# Patient Record
Sex: Male | Born: 1975
Health system: Southern US, Community
[De-identification: ages and names within clinical notes are randomized; demographics above are authoritative.]

## PROBLEM LIST (undated history)

## (undated) DIAGNOSIS — T7840XA Allergy, unspecified, initial encounter: Secondary | ICD-10-CM

## (undated) HISTORY — DX: Allergy, unspecified, initial encounter: T78.40XA

---

## 2001-10-31 ENCOUNTER — Encounter: Payer: Self-pay | Admitting: Emergency Medicine

## 2001-10-31 ENCOUNTER — Emergency Department (HOSPITAL_COMMUNITY): Admission: EM | Admit: 2001-10-31 | Discharge: 2001-10-31 | Payer: Self-pay | Admitting: Emergency Medicine

## 2003-03-20 ENCOUNTER — Emergency Department (HOSPITAL_COMMUNITY): Admission: EM | Admit: 2003-03-20 | Discharge: 2003-03-20 | Payer: Self-pay | Admitting: Emergency Medicine

## 2003-11-12 ENCOUNTER — Emergency Department (HOSPITAL_COMMUNITY): Admission: EM | Admit: 2003-11-12 | Discharge: 2003-11-12 | Payer: Self-pay
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
## 2009-07-20 ENCOUNTER — Ambulatory Visit: Payer: Self-pay | Admitting: Diagnostic Radiology

## 2009-07-20 ENCOUNTER — Emergency Department (HOSPITAL_BASED_OUTPATIENT_CLINIC_OR_DEPARTMENT_OTHER): Admission: EM | Admit: 2009-07-20 | Discharge: 2009-07-20 | Payer: Self-pay | Admitting: Emergency Medicine

## 2012-04-15 ENCOUNTER — Emergency Department (HOSPITAL_BASED_OUTPATIENT_CLINIC_OR_DEPARTMENT_OTHER): Payer: Self-pay

## 2012-04-15 ENCOUNTER — Emergency Department (HOSPITAL_BASED_OUTPATIENT_CLINIC_OR_DEPARTMENT_OTHER)
Admission: EM | Admit: 2012-04-15 | Discharge: 2012-04-15 | Disposition: A | Discharge status: Home / Self Care | Payer: Self-pay | Attending: Emergency Medicine | Admitting: Emergency Medicine

## 2012-04-15 ENCOUNTER — Encounter (HOSPITAL_BASED_OUTPATIENT_CLINIC_OR_DEPARTMENT_OTHER): Payer: Self-pay | Admitting: *Deleted

## 2012-04-15 DIAGNOSIS — S93409A Sprain of unspecified ligament of unspecified ankle, initial encounter: Secondary | ICD-10-CM | POA: Insufficient documentation

## 2012-04-15 DIAGNOSIS — Y929 Unspecified place or not applicable: Secondary | ICD-10-CM | POA: Insufficient documentation

## 2012-04-15 DIAGNOSIS — Y9389 Activity, other specified: Secondary | ICD-10-CM | POA: Insufficient documentation

## 2012-04-15 DIAGNOSIS — S93402A Sprain of unspecified ligament of left ankle, initial encounter: Secondary | ICD-10-CM

## 2012-04-15 DIAGNOSIS — F17200 Nicotine dependence, unspecified, uncomplicated: Secondary | ICD-10-CM | POA: Insufficient documentation

## 2012-04-15 DIAGNOSIS — W1789XA Other fall from one level to another, initial encounter: Secondary | ICD-10-CM | POA: Insufficient documentation
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
## 2012-04-16 NOTE — ED Provider Notes (Signed)
History     CSN: [id number]  Arrival date & time 04/15/12  1213   First MD Initiated Contact with Patient 04/15/12 1438      Chief Complaint  Patient presents with  . Ankle Injury    (Consider location/radiation/quality/duration/timing/severity/associated sxs/prior treatment) Patient is a 37 y.o. male presenting with lower extremity injury. The history is provided by the patient. No language interpreter was used.  Ankle Injury This is a new problem. The current episode started 2 days ago. The problem occurs constantly. The problem has not changed since onset.Associated symptoms comments: None . The symptoms are aggravated by standing and walking. Nothing relieves the symptoms. He has tried rest and a cold compress for the symptoms. The treatment provided mild relief.    History reviewed. No pertinent past medical history.  History reviewed. No pertinent past surgical history.  History reviewed. No pertinent family history.  History  Substance Use Topics  . Smoking status: Current Every Day Smoker    Types: Cigars  . Smokeless tobacco: Not on file  . Alcohol Use: No      Review of Systems  All other systems reviewed and are negative.    Allergies  Review of patient's allergies indicates no known allergies.  Home Medications  No current outpatient prescriptions on file.  BP 135/98  Pulse 88  Temp(Src) 97.8 F (36.6 C) (Oral)  Resp 15  Ht 5\' 8"  (1.727 m)  Wt 198 lb (89.812 kg)  BMI 30.11 kg/m2  SpO2 99%  Physical Exam  Constitutional: He is oriented to person, place, and time. He appears well-developed and well-nourished. No distress.  Musculoskeletal:  Mild swelling over the left lateral malleolus.  Skin intact.  Intact pulses, sensation and tendon function in the left foot.    Neurological: He is alert and oriented to person, place, and time.  No sensory or motor deficit.  Skin: Skin is warm and dry.  Psychiatric: He has a normal mood and affect. His  behavior is normal.    ED Course  Procedures (including critical care time)  Labs Reviewed - No data to display Dg Ankle Complete Left  04/15/2012  *RADIOLOGY REPORT*  Clinical Data: Left ankle pain after injury.  LEFT ANKLE COMPLETE - 3+ VIEW  Comparison: None.  Findings: No fracture or dislocation is noted.  Joint spaces are intact. No soft tissue abnormality is noted.  IMPRESSION: Normal left ankle.   Original Report Authenticated By: Lupita Raider.,  M.D.    Rx with ASO, ice elevation.  1. Sprain of left ankle        Carleene Cooper III, MD 04/16/12 1329

## 2013-03-26 ENCOUNTER — Emergency Department: Payer: Self-pay | Admitting: Emergency Medicine

## 2015-02-10 ENCOUNTER — Emergency Department (HOSPITAL_BASED_OUTPATIENT_CLINIC_OR_DEPARTMENT_OTHER): Payer: BLUE CROSS/BLUE SHIELD

## 2015-02-10 ENCOUNTER — Emergency Department (HOSPITAL_BASED_OUTPATIENT_CLINIC_OR_DEPARTMENT_OTHER)
Admission: EM | Admit: 2015-02-10 | Discharge: 2015-02-11 | Disposition: A | Discharge status: Home / Self Care | Payer: BLUE CROSS/BLUE SHIELD | Attending: Emergency Medicine | Admitting: Emergency Medicine

## 2015-02-10 ENCOUNTER — Encounter (HOSPITAL_BASED_OUTPATIENT_CLINIC_OR_DEPARTMENT_OTHER): Payer: Self-pay | Admitting: *Deleted

## 2015-02-10 DIAGNOSIS — S29001A Unspecified injury of muscle and tendon of front wall of thorax, initial encounter: Secondary | ICD-10-CM | POA: Insufficient documentation

## 2015-02-10 DIAGNOSIS — Y9389 Activity, other specified: Secondary | ICD-10-CM | POA: Insufficient documentation

## 2015-02-10 DIAGNOSIS — Y92410 Unspecified street and highway as the place of occurrence of the external cause: Secondary | ICD-10-CM | POA: Insufficient documentation

## 2015-02-10 DIAGNOSIS — S298XXA Other specified injuries of thorax, initial encounter: Secondary | ICD-10-CM

## 2015-02-10 DIAGNOSIS — S3991XA Unspecified injury of abdomen, initial encounter: Secondary | ICD-10-CM | POA: Insufficient documentation

## 2015-02-10 DIAGNOSIS — Y998 Other external cause status: Secondary | ICD-10-CM | POA: Insufficient documentation

## 2015-02-10 DIAGNOSIS — F17210 Nicotine dependence, cigarettes, uncomplicated: Secondary | ICD-10-CM | POA: Insufficient documentation

## 2015-02-10 NOTE — ED Notes (Signed)
MD at bedside. 
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
## 2015-02-11 MED ORDER — FENTANYL CITRATE (PF) 100 MCG/2ML IJ SOLN
100.0000 ug | Freq: Once | INTRAMUSCULAR | Status: AC
  Administered 2015-02-11: 100 ug via INTRAVENOUS
  Filled 2015-02-11: qty 2

## 2015-02-11 MED ORDER — IOHEXOL 300 MG/ML  SOLN
25.0000 mL | Freq: Once | INTRAMUSCULAR | Status: AC | PRN
  Administered 2015-02-11: 25 mL via ORAL

## 2015-02-11 MED ORDER — HYDROCODONE-ACETAMINOPHEN 5-325 MG PO TABS: 1.0000 | ORAL_TABLET | Freq: Four times a day (QID) | ORAL | Status: DC | PRN

## 2015-02-11 MED ORDER — IOHEXOL 300 MG/ML  SOLN
100.0000 mL | Freq: Once | INTRAMUSCULAR | Status: AC | PRN
  Administered 2015-02-11: 100 mL via INTRAVENOUS

## 2015-02-11 NOTE — ED Notes (Signed)
Patient transported to CT 

## 2015-02-11 NOTE — ED Notes (Signed)
MD at bedside discussing test results and dispo plan of care. 

## 2015-02-11 NOTE — Discharge Instructions (Signed)

## 2016-10-25 DIAGNOSIS — J302 Other seasonal allergic rhinitis: Secondary | ICD-10-CM | POA: Insufficient documentation

## 2016-10-25 DIAGNOSIS — H1045 Other chronic allergic conjunctivitis: Secondary | ICD-10-CM | POA: Insufficient documentation

## 2016-10-25 DIAGNOSIS — E782 Mixed hyperlipidemia: Secondary | ICD-10-CM | POA: Insufficient documentation

## 2016-10-25 HISTORY — DX: Other chronic allergic conjunctivitis: H10.45

## 2018-12-31 ENCOUNTER — Other Ambulatory Visit: Payer: Self-pay

## 2019-01-01 ENCOUNTER — Encounter: Payer: Self-pay | Admitting: Nurse Practitioner

## 2019-01-01 ENCOUNTER — Ambulatory Visit (INDEPENDENT_AMBULATORY_CARE_PROVIDER_SITE_OTHER): Payer: BC Managed Care – PPO | Admitting: Nurse Practitioner

## 2019-01-01 VITALS — BP 118/76 | HR 85 | Temp 98.2°F | Ht 68.0 in | Wt 198.8 lb

## 2019-01-01 DIAGNOSIS — Z136 Encounter for screening for cardiovascular disorders: Secondary | ICD-10-CM

## 2019-01-01 DIAGNOSIS — Z13220 Encounter for screening for lipoid disorders: Secondary | ICD-10-CM | POA: Diagnosis not present

## 2019-01-01 DIAGNOSIS — Z0001 Encounter for general adult medical examination with abnormal findings: Secondary | ICD-10-CM

## 2019-01-01 DIAGNOSIS — Z23 Encounter for immunization: Secondary | ICD-10-CM | POA: Diagnosis not present

## 2019-01-01 DIAGNOSIS — K644 Residual hemorrhoidal skin tags: Secondary | ICD-10-CM

## 2019-01-01 DIAGNOSIS — Z114 Encounter for screening for human immunodeficiency virus [HIV]: Secondary | ICD-10-CM | POA: Diagnosis not present

## 2019-01-01 MED ORDER — NITROGLYCERIN 2 % TD OINT
TOPICAL_OINTMENT | TRANSDERMAL | 0 refills | Status: DC
Start: 2019-01-01 — End: 2019-01-07

## 2019-01-01 NOTE — Patient Instructions (Signed)
Schedule dental exam every 50months and eye exam every 1-10yrs.  Go to lab for blood draw.  If no improvement with hemorrhoid, contact office.  Nitroglycerin rectal ointment What is this medicine? NITROGLYCERIN (nye troe GLI ser in) is a type of vasodilator that can decrease pressure within the anus. It is used to treat moderate to severe pain caused by a tear in the skin lining the anal canal. This medicine may be used for other purposes; ask your health care provider or pharmacist if you have questions. COMMON BRAND NAME(S): RECTIV What should I tell my health care provider before I take this medicine? They need to know if you have any of these conditions:  head injury, recent stroke, or bleeding in the brain  heart disease  low blood counts, like red cell counts  low blood pressure  migraines or recurrent headaches  an unusual or allergic reaction to nitroglycerin, other medicines, foods, dyes, or preservatives  pregnant or trying to get pregnant  breast-feeding How should I use this medicine? This medicine is for rectal use only. Do not take by mouth. Follow the directions on the prescription label. Use exactly as directed. Cover a finger with plastic-wrap or surgical glove. Use the dosing guide on the medicine box to measure the dose. With the ointment on the covered finger, insert into anal canal up to the first finger joint. After application, throw away the finger covering. Wash hands after applying ointment. Talk to your pediatrician regarding the use of this medicine in children. Special care may be needed. Overdosage: If you think you have taken too much of this medicine contact a poison control center or emergency room at once. NOTE: This medicine is only for you. Do not share this medicine with others. What if I miss a dose? If you miss a dose, use it as soon as you can. If it is almost time for your next dose, use only that dose. Do not use double or extra doses. What  may interact with this medicine? Do not take this medicine with any of the following medications:  medicines used to treat erectile dysfunction like sildenafil, tadalafil, and vardenafil  riociguat This medicine may also interact with the following medications:  alteplase  aspirin  certain migraine medicines like ergotamine and dihydroergotamine (DHE)  heparin  medicines for high blood pressure  other medicines used to treat angina This list may not describe all possible interactions. Give your health care provider a list of all the medicines, herbs, non-prescription drugs, or dietary supplements you use. Also tell them if you smoke, drink alcohol, or use illegal drugs. Some items may interact with your medicine. What should I watch for while using this medicine? Tell your doctor or health care professional if you feel your medicine is no longer working. You may get drowsy or dizzy. Do not drive, use machinery, or do anything that needs mental alertness until you know how this drug affects you. Do not stand or sit up quickly, especially if you are an older patient. This reduces the risk of dizzy or fainting spells. Alcohol can make you more drowsy and dizzy. Avoid alcoholic drinks. Do not treat yourself for coughs, colds, or pain while you are taking this medicine without asking your doctor or health care professional for advice. Some ingredients may increase your blood pressure. What side effects may I notice from receiving this medicine? Side effects that you should report to your doctor or health care professional as soon as possible:  allergic reactions like skin rash, itching or hives, swelling of the face, lips, or tongue Side effects that usually do not require medical attention (report to your doctor or health care professional if they continue or are bothersome):  dizziness  feeling faint or lightheaded, falls  headache This list may not describe all possible side effects.  Call your doctor for medical advice about side effects. You may report side effects to FDA at 1-800-FDA-1088. Where should I keep my medicine? Keep out of the reach of children. Store at room temperature between 20 and 25 degrees C (68 and 77 degrees F). Close tightly after each use. Throw away any unused medicine after the expiration date. NOTE: This sheet is a summary. It may not cover all possible information. If you have questions about this medicine, talk to your doctor, pharmacist, or health care provider.  2020 Elsevier/Gold Standard (2015-03-26 09:01:08)  Health Maintenance, Male Adopting a healthy lifestyle and getting preventive care are important in promoting health and wellness. Ask your health care provider about:  The right schedule for you to have regular tests and exams.  Things you can do on your own to prevent diseases and keep yourself healthy. What should I know about diet, weight, and exercise? Eat a healthy diet   Eat a diet that includes plenty of vegetables, fruits, low-fat dairy products, and lean protein.  Do not eat a lot of foods that are high in solid fats, added sugars, or sodium. Maintain a healthy weight Body mass index (BMI) is a measurement that can be used to identify possible weight problems. It estimates body fat based on height and weight. Your health care provider can help determine your BMI and help you achieve or maintain a healthy weight. Get regular exercise Get regular exercise. This is one of the most important things you can do for your health. Most adults should:  Exercise for at least 150 minutes each week. The exercise should increase your heart rate and make you sweat (moderate-intensity exercise).  Do strengthening exercises at least twice a week. This is in addition to the moderate-intensity exercise.  Spend less time sitting. Even light physical activity can be beneficial. Watch cholesterol and blood lipids Have your blood tested  for lipids and cholesterol at 44 years of age, then have this test every 5 years. You may need to have your cholesterol levels checked more often if:  Your lipid or cholesterol levels are high.  You are older than 44 years of age.  You are at high risk for heart disease. What should I know about cancer screening? Many types of cancers can be detected early and may often be prevented. Depending on your health history and family history, you may need to have cancer screening at various ages. This may include screening for:  Colorectal cancer.  Prostate cancer.  Skin cancer.  Lung cancer. What should I know about heart disease, diabetes, and high blood pressure? Blood pressure and heart disease  High blood pressure causes heart disease and increases the risk of stroke. This is more likely to develop in people who have high blood pressure readings, are of African descent, or are overweight.  Talk with your health care provider about your target blood pressure readings.  Have your blood pressure checked: ? Every 3-5 years if you are 77-45 years of age. ? Every year if you are 22 years old or older.  If you are between the ages of 38 and 20 and are a current  or former smoker, ask your health care provider if you should have a one-time screening for abdominal aortic aneurysm (AAA). Diabetes Have regular diabetes screenings. This checks your fasting blood sugar level. Have the screening done:  Once every three years after age 44 if you are at a normal weight and have a low risk for diabetes.  More often and at a younger age if you are overweight or have a high risk for diabetes. What should I know about preventing infection? Hepatitis B If you have a higher risk for hepatitis B, you should be screened for this virus. Talk with your health care provider to find out if you are at risk for hepatitis B infection. Hepatitis C Blood testing is recommended for:  Everyone born from [id number]  through 1966.  Anyone with known risk factors for hepatitis C. Sexually transmitted infections (STIs)  You should be screened each year for STIs, including gonorrhea and chlamydia, if: ? You are sexually active and are younger than 44 years of age. ? You are older than 44 years of age and your health care provider tells you that you are at risk for this type of infection. ? Your sexual activity has changed since you were last screened, and you are at increased risk for chlamydia or gonorrhea. Ask your health care provider if you are at risk.  Ask your health care provider about whether you are at high risk for HIV. Your health care provider may recommend a prescription medicine to help prevent HIV infection. If you choose to take medicine to prevent HIV, you should first get tested for HIV. You should then be tested every 3 months for as long as you are taking the medicine. Follow these instructions at home: Lifestyle  Do not use any products that contain nicotine or tobacco, such as cigarettes, e-cigarettes, and chewing tobacco. If you need help quitting, ask your health care provider.  Do not use street drugs.  Do not share needles.  Ask your health care provider for help if you need support or information about quitting drugs. Alcohol use  Do not drink alcohol if your health care provider tells you not to drink.  If you drink alcohol: ? Limit how much you have to 0-2 drinks a day. ? Be aware of how much alcohol is in your drink. In the [place] one drink equals one 12 oz bottle of beer (355 mL), one 5 oz glass of wine (148 mL), or one 1 oz glass of hard liquor (44 mL). General instructions  Schedule regular health, dental, and eye exams.  Stay current with your vaccines.  Tell your health care provider if: ? You often feel depressed. ? You have ever been abused or do not feel safe at home. Summary  Adopting a healthy lifestyle and getting preventive care are important in  promoting health and wellness.  Follow your health care provider's instructions about healthy diet, exercising, and getting tested or screened for diseases.  Follow your health care provider's instructions on monitoring your cholesterol and blood pressure. This information is not intended to replace advice given to you by your health care provider. Make sure you discuss any questions you have with your health care provider. Document Released: 08/20/2007 Document Revised: 02/14/2018 Document Reviewed: 02/14/2018 Elsevier Patient Education  2020 [employer]

## 2019-01-01 NOTE — Progress Notes (Signed)
Subjective:    Patient ID: Thomas Suarez, male    DOB: [birth year], 43 y.o.   MRN: [id number]  Patient presents today for complete physical and eval of hemorrhoid  HPI Hemorrhoid: Chronic, ongoing for years, waxing and weaning, no rectal bleeding, no rectal pain, no ABD pain, no constipation of diarrhea. Minimal improvement with preparation H, lidocaine, hydrocortisone and epsom salt sitz bath.  Sexual History (orientation,birth control, marital status, STD):single, sexually active, male partner.  Depression/Suicide: Depression screen [facility] 2/9 01/01/2019  Decreased Interest 0  Down, Depressed, Hopeless 0  PHQ - 2 Score 0   Vision:will schedule, use of corrective lens  Dental:will schedule  Immunizations: (TDAP, Hep C screen, Pneumovax, Influenza, zoster)  Health Maintenance  Topic Date Due  . HIV Screening  07/16/1989  . Flu Shot  06/05/2019*  . Tetanus Vaccine  12/31/2028  *Topic was postponed. The date shown is not the original due date.   Diet:heart healthy  Weight:  Wt Readings from Last 3 Encounters:  01/01/19 198 lb 12.8 oz (90.2 kg)  02/10/15 190 lb (86.2 kg)  04/15/12 198 lb (89.8 kg)    Exercise:5x/week, cardio and weight training.  Fall Risk: Fall Risk  01/01/2019  Falls in the past year? 0   Advanced Directive: Advanced Directives 02/10/2015  Does Patient Have a Medical Advance Directive? No    Medications and allergies reviewed with patient and updated if appropriate.  There are no active problems to display for this patient.   Current Outpatient Medications on File Prior to Visit  Medication Sig Dispense Refill  . fluticasone (FLONASE) 50 MCG/ACT nasal spray Place into the nose.    . montelukast (SINGULAIR) 10 MG tablet Take 10 mg by mouth daily.    Marland Kitchen HYDROcodone-acetaminophen (NORCO/VICODIN) 5-325 MG tablet Take 1-2 tablets by mouth every 6 (six) hours as needed (for pain). (Patient not taking: Reported on 01/01/2019) 20 tablet 0   No  current facility-administered medications on file prior to visit.     Past Medical History:  Diagnosis Date  . Allergy     No past surgical history on file.  Social History   Socioeconomic History  . Marital status: Single    Spouse name: Not on file  . Number of children: Not on file  . Years of education: Not on file  . Highest education level: Not on file  Occupational History  . Not on file  Social Needs  . Financial resource strain: Not on file  . Food insecurity    Worry: Not on file    Inability: Not on file  . Transportation needs    Medical: Not on file    Non-medical: Not on file  Tobacco Use  . Smoking status: Former Smoker    Types: Cigars    Quit date: 03/07/2008    Years since quitting: 10.8  . Smokeless tobacco: Never Used  Substance and Sexual Activity  . Alcohol use: No  . Drug use: No  . Sexual activity: Not on file  Lifestyle  . Physical activity    Days per week: Not on file    Minutes per session: Not on file  . Stress: Not on file  Relationships  . Social Herbalist on phone: Not on file    Gets together: Not on file    Attends religious service: Not on file    Active member of club or organization: Not on file    Attends meetings of clubs or organizations: Not  on file    Relationship status: Not on file  Other Topics Concern  . Not on file  Social History Narrative  . Not on file    Family History  Problem Relation Age of Onset  . Hypertension Maternal Grandmother         Review of Systems  Constitutional: Negative for fever, malaise/fatigue and weight loss.  HENT: Negative for congestion and sore throat.   Eyes:       Negative for visual changes  Respiratory: Negative for cough and shortness of breath.   Cardiovascular: Negative for chest pain, palpitations and leg swelling.  Gastrointestinal: Negative for abdominal pain, blood in stool, constipation, diarrhea, heartburn and nausea.  Genitourinary: Negative for  dysuria, frequency and urgency.  Musculoskeletal: Negative for falls, joint pain and myalgias.  Skin: Negative for rash.  Neurological: Negative for dizziness, sensory change and headaches.  Endo/Heme/Allergies: Does not bruise/bleed easily.  Psychiatric/Behavioral: Negative for depression, substance abuse and suicidal ideas. The patient is not nervous/anxious.     Objective:   Vitals:   01/01/19 1441  BP: 118/76  Pulse: 85  Temp: 98.2 F (36.8 C)  SpO2: 97%    Body mass index is 30.23 kg/m.   Physical Examination:  Physical Exam Exam conducted with a chaperone present.  Constitutional:      General: He is not in acute distress.    Appearance: He is obese.  HENT:     Head: Normocephalic.     Right Ear: Tympanic membrane, ear canal and external ear normal.     Left Ear: Tympanic membrane, ear canal and external ear normal.  Eyes:     Extraocular Movements: Extraocular movements intact.     Conjunctiva/sclera: Conjunctivae normal.  Neck:     Musculoskeletal: Normal range of motion and neck supple.     Thyroid: No thyromegaly.  Cardiovascular:     Rate and Rhythm: Normal rate.     Heart sounds: Normal heart sounds.  Pulmonary:     Effort: Pulmonary effort is normal.     Breath sounds: Normal breath sounds.  Chest:     Chest wall: No tenderness.  Abdominal:     General: Bowel sounds are normal. There is no distension.     Palpations: Abdomen is soft.     Tenderness: There is no abdominal tenderness.  Genitourinary:    Rectum: Tenderness, anal fissure and external hemorrhoid present.  Musculoskeletal: Normal range of motion.        General: No tenderness.  Lymphadenopathy:     Cervical: No cervical adenopathy.  Skin:    General: Skin is warm and dry.  Neurological:     Mental Status: He is alert and oriented to person, place, and time.  Psychiatric:        Mood and Affect: Mood normal.        Behavior: Behavior normal.        Thought Content: Thought  content normal.    ASSESSMENT and PLAN:  Artis FlockJamaal was seen today for establish care.  Diagnoses and all orders for this visit:  Encounter for preventative adult health care exam with abnormal findings -     CBC -     Comprehensive metabolic panel -     Lipid panel -     TSH  Inflamed external hemorrhoid -     nitroGLYCERIN (NITROGLYN) 2 % ointment; Apply pea size to external hemorrhoid twice a day x5days  Encounter for lipid screening for cardiovascular disease -  Lipid panel  Need for diphtheria-tetanus-pertussis (Tdap) vaccine -     Tdap vaccine greater than or equal to 7yo IM  Encounter for screening for HIV -     HIV antibody (with reflex)    No problem-specific Assessment & Plan notes found for this encounter.      Problem List Items Addressed This Visit    None    Visit Diagnoses    Encounter for preventative adult health care exam with abnormal findings    -  Primary   Relevant Orders   CBC   Comprehensive metabolic panel   Lipid panel   TSH   Inflamed external hemorrhoid       Relevant Medications   nitroGLYCERIN (NITROGLYN) 2 % ointment   Encounter for lipid screening for cardiovascular disease       Relevant Orders   Lipid panel   Need for diphtheria-tetanus-pertussis (Tdap) vaccine       Relevant Orders   Tdap vaccine greater than or equal to 7yo IM (Completed)   Encounter for screening for HIV       Relevant Orders   HIV antibody (with reflex)       Follow up: Return if symptoms worsen or fail to improve.  Alysia Penna, NP

## 2019-01-02 LAB — CBC
HCT: 45.5 % (ref 39.0–52.0)
Hemoglobin: 15.5 g/dL (ref 13.0–17.0)
MCHC: 34.1 g/dL (ref 30.0–36.0)
MCV: 96.4 fl (ref 78.0–100.0)
Platelets: 244 10*3/uL (ref 150.0–400.0)
RBC: 4.72 Mil/uL (ref 4.22–5.81)
RDW: 13 % (ref 11.5–15.5)
WBC: 4.2 10*3/uL (ref 4.0–10.5)

## 2019-01-03 ENCOUNTER — Other Ambulatory Visit: Payer: Self-pay | Admitting: Nurse Practitioner

## 2019-01-03 DIAGNOSIS — Z114 Encounter for screening for human immunodeficiency virus [HIV]: Secondary | ICD-10-CM

## 2019-01-03 LAB — LIPID PANEL
Cholesterol: 196 mg/dL (ref <200)
HDL: 41 mg/dL (ref >=40)
LDL Cholesterol (Calc): 135 mg/dL (calc) — ABNORMAL HIGH
Non-HDL Cholesterol (Calc): 155 mg/dL (calc) — ABNORMAL HIGH (ref <130)
Total CHOL/HDL Ratio: 4.8 (calc) (ref <5.0)
Triglycerides: 97 mg/dL (ref <150)

## 2019-01-03 LAB — COMPREHENSIVE METABOLIC PANEL
AG Ratio: 1.8 (calc) (ref 1.0–2.5)
ALT: 17 U/L (ref 9–46)
AST: 15 U/L (ref 10–40)
Albumin: 4.9 g/dL (ref 3.6–5.1)
Alkaline phosphatase (APISO): 63 U/L (ref 36–130)
BUN: 15 mg/dL (ref 7–25)
CO2: 24 mmol/L (ref 20–32)
Calcium: 10.1 mg/dL (ref 8.6–10.3)
Chloride: 103 mmol/L (ref 98–110)
Creat: 1.07 mg/dL (ref 0.60–1.35)
Globulin: 2.7 g/dL (calc) (ref 1.9–3.7)
Glucose, Bld: 87 mg/dL (ref 65–99)
Potassium: 4.3 mmol/L (ref 3.5–5.3)
Sodium: 138 mmol/L (ref 135–146)
Total Bilirubin: 0.5 mg/dL (ref 0.2–1.2)
Total Protein: 7.6 g/dL (ref 6.1–8.1)

## 2019-01-03 LAB — TSH: TSH: 0.45 mIU/L (ref 0.40–4.50)

## 2019-01-03 LAB — HIV ANTIBODY (ROUTINE TESTING W REFLEX)

## 2019-01-07 ENCOUNTER — Telehealth: Payer: Self-pay | Admitting: Nurse Practitioner

## 2019-01-07 DIAGNOSIS — K644 Residual hemorrhoidal skin tags: Secondary | ICD-10-CM

## 2019-01-07 NOTE — Telephone Encounter (Signed)
Patient states he was advised to d/c nitroGLYCERIN (NITROGLYN) 2 % ointment and to inform PCP if symptoms did not improve. Patient experiencing head aches long lasting and patient would like a follow up call from nurse. Patient states PCP recommend placing a referral if symptoms did not improve, please advise

## 2019-01-07 NOTE — Telephone Encounter (Signed)
Pt stated he finished the nitroglyn Saturday but he still experiencing pain and swelling. Pt also mention that right after he takes this med he notice a bad headache--not sure if this is a side effect.   Charlotte please advise.

## 2019-01-07 NOTE — Telephone Encounter (Signed)
Unfortunately headache is one of the side effects. At this point I this he will benefit from an evaluation by GI.

## 2019-01-08 NOTE — Telephone Encounter (Signed)
Pt is aware. He is unable to take the appt offer on 11/9 and 11/10--has to work.

## 2019-01-15 ENCOUNTER — Other Ambulatory Visit: Payer: Self-pay | Admitting: Nurse Practitioner

## 2019-01-15 ENCOUNTER — Encounter: Payer: Self-pay | Admitting: Nurse Practitioner

## 2019-01-15 MED ORDER — MONTELUKAST SODIUM 10 MG PO TABS: 10.0000 mg | ORAL_TABLET | Freq: Every day | ORAL | 3 refills | Status: DC

## 2019-01-15 NOTE — Telephone Encounter (Signed)
Please advise, we haven't send in this rx before.

## 2019-01-15 NOTE — Telephone Encounter (Signed)
Requested medication (s) are due for refill today: yes  Requested medication (s) are on the active medication list:yes  Last refill: 12/2018  Future visit scheduled: no  Notes to clinic: [place] filled by historical provider   Patient requesting a 90 day supply montelukast (SINGULAIR) 10 MG tablet , informed patient please allow 48 to 72 hour turn around time. Patient states he's completely out and would like request expedited   Requested Prescriptions  Pending Prescriptions Disp Refills   montelukast (SINGULAIR) 10 MG tablet       Sig: Take 1 tablet (10 mg total) by mouth daily.     Pulmonology:  Leukotriene Inhibitors Passed - 01/15/2019 12:12 PM      Passed - Valid encounter within last 12 months    Recent Outpatient Visits          2 weeks ago Encounter for preventative adult health care exam with abnormal findings   [facility] [address] Charlene Brooke, NP

## 2019-01-15 NOTE — Telephone Encounter (Signed)
Patient requesting a 90 day supply montelukast (SINGULAIR) 10 MG tablet , informed patient please allow 48 to 72 hour turn around time. Patient states he's completely out and would like request expedited   [place], [facility] [place]

## 2019-01-16 ENCOUNTER — Telehealth: Payer: Self-pay | Admitting: Nurse Practitioner

## 2019-01-16 DIAGNOSIS — K644 Residual hemorrhoidal skin tags: Secondary | ICD-10-CM

## 2019-01-16 NOTE — Telephone Encounter (Signed)
Thomas Suarez please advise   Copied from [place] [id number]. Topic: Referral - Status >> Jan 16, 2019  1:36 PM Scherrie Gerlach wrote: Reason for CRM: pt states [facility] advised pt they will not remove the hemorrhoid, only give him medication. Pt states he wants it removed.  Pt wants a referral to have it gone, removed.  Pt does not want to go there if they are not going to take off, and they told him they were not. Pt states this is an urgent and he is not better.  Pt is going to cancel GI appt.

## 2019-01-17 ENCOUNTER — Ambulatory Visit: Payer: BC Managed Care – PPO | Admitting: Physician Assistant

## 2019-01-17 NOTE — Telephone Encounter (Signed)
Pt is aware, referral entered.  

## 2019-01-17 NOTE — Telephone Encounter (Signed)
I spoke with the pt and he said he wants it remove, pt does want just medication to treat pain.   I spoke with the [facility] department, they stated that only treatment they are doing over there for external hemorrhoid is to give medication for pain. They work more with internal hemorrhoid. If the pt needs to get external hemorrhoid remove---referral need to go to surgeon.   Please advise.

## 2019-01-17 NOTE — Telephone Encounter (Signed)
I say the same thing I said before. A referral to GI or surgeon does not guarantee procedure will be done. The specialist makes decision if it meets criteria for removal. Ok to enter referral to surgeon as long as he understands that.

## 2019-01-17 NOTE — Telephone Encounter (Signed)
Unfortunately a referral to another GI does not guarantee that procedure will be done. GI makes that final decision

## 2019-08-02 ENCOUNTER — Ambulatory Visit (INDEPENDENT_AMBULATORY_CARE_PROVIDER_SITE_OTHER): Payer: BC Managed Care – PPO | Admitting: Family Medicine

## 2019-08-02 ENCOUNTER — Encounter: Payer: Self-pay | Admitting: Family Medicine

## 2019-08-02 ENCOUNTER — Other Ambulatory Visit: Payer: Self-pay

## 2019-08-02 VITALS — BP 122/86 | HR 17 | Temp 97.9°F | Resp 16 | Ht 68.0 in | Wt 205.2 lb

## 2019-08-02 DIAGNOSIS — L255 Unspecified contact dermatitis due to plants, except food: Secondary | ICD-10-CM

## 2019-08-02 MED ORDER — PREDNISONE 20 MG PO TABS: ORAL_TABLET | ORAL | 0 refills | Status: DC

## 2019-08-02 MED ORDER — METHYLPREDNISOLONE ACETATE 80 MG/ML IJ SUSP
80.0000 mg | Freq: Once | INTRAMUSCULAR | Status: AC
  Administered 2019-08-02: 80 mg via INTRAMUSCULAR

## 2019-08-02 NOTE — Patient Instructions (Addendum)
Follow up as needed or as scheduled START the new Prednisone dosing schedule tomorrow morning- take w/ food AOVID hot showers- this will worsen the itching Use Calamine lotions, Benadryl cream, and antihistamine (like Claritin or Zyrtec to help w/ itching) Call with any questions or concerns Hang in there!

## 2019-08-02 NOTE — Progress Notes (Signed)
   Subjective:    Patient ID: Thomas Suarez, male    DOB: 03/08/1975, 44 y.o.   MRN: [id number]  HPI Poison Lajoyce Corners- pt had a tele health visit on 5/25.  Was started on Prednisone 10mg  3x/day x2 days, then 2x/day x2 days, then 1 tab daily x2 days.  Pt doesn't feel that things are improving.  Has extensive rash of both legs from knees down to ankles and has areas on both wrists.   Review of Systems For ROS see HPI   This visit occurred during the SARS-CoV-2 public health emergency.  Safety protocols were in place, including screening questions prior to the visit, additional usage of staff PPE, and extensive cleaning of exam room while observing appropriate contact time as indicated for disinfecting solutions.       Objective:   Physical Exam Vitals reviewed.  Constitutional:      General: He is not in acute distress.    Appearance: Normal appearance. He is not ill-appearing.  Skin:    General: Skin is warm and dry.     Findings: Rash (pt has diffuse rash of lower legs bilaterally (covered in calamine lotion) and vesicular rash on wrists bilaterally) present.  Neurological:     General: No focal deficit present.     Mental Status: He is alert and oriented to person, place, and time.           Assessment & Plan:  Plant dermatitis- new.  Likely poison ivy and w/ extensive distribution.  Depo-Medrol given in office.  Will do higher dose pred taper starting tomorrow.  Reviewed supportive care and red flags that should prompt return.  Pt expressed understanding and is in agreement w/ plan.

## 2020-01-20 ENCOUNTER — Other Ambulatory Visit: Payer: Self-pay | Admitting: Nurse Practitioner

## 2020-01-20 NOTE — Telephone Encounter (Signed)
Please see message and advise.  Thank you. Last OV 01/01/19 Last fill 01/15/19  #90/3

## 2020-03-17 ENCOUNTER — Other Ambulatory Visit: Payer: Self-pay | Admitting: Nurse Practitioner

## 2020-03-19 NOTE — Telephone Encounter (Signed)
Last OV 01/01/19 Last fill 01/20/20  #90/1 Patient need an office visit.

## 2020-03-20 ENCOUNTER — Other Ambulatory Visit: Payer: Self-pay

## 2020-03-20 ENCOUNTER — Other Ambulatory Visit: Payer: Self-pay | Admitting: Nurse Practitioner

## 2020-03-20 MED ORDER — MONTELUKAST SODIUM 10 MG PO TABS: 10.0000 mg | ORAL_TABLET | Freq: Every day | ORAL | 0 refills | Status: DC

## 2020-03-20 NOTE — Telephone Encounter (Signed)
Last fill 01/20/20 #90/1 Last OV 01/01/19 Patient need a office visit before any other refills.

## 2020-04-07 ENCOUNTER — Other Ambulatory Visit: Payer: Self-pay

## 2020-04-08 ENCOUNTER — Encounter: Payer: Self-pay | Admitting: Nurse Practitioner

## 2020-04-08 ENCOUNTER — Other Ambulatory Visit: Payer: Self-pay | Admitting: Nurse Practitioner

## 2020-04-08 ENCOUNTER — Ambulatory Visit (INDEPENDENT_AMBULATORY_CARE_PROVIDER_SITE_OTHER): Payer: No Typology Code available for payment source | Admitting: Nurse Practitioner

## 2020-04-08 VITALS — BP 114/78 | HR 92 | Temp 97.7°F | Ht 68.0 in | Wt 195.4 lb

## 2020-04-08 DIAGNOSIS — H60312 Diffuse otitis externa, left ear: Secondary | ICD-10-CM

## 2020-04-08 MED ORDER — NEOMYCIN-POLYMYXIN-HC 3.5-10000-1 OT SOLN: 4.0000 [drp] | Freq: Three times a day (TID) | OTIC | 0 refills | Status: DC

## 2020-04-08 NOTE — Progress Notes (Signed)
   Subjective:  Patient ID: Thomas Suarez, male    DOB: [birth year]  Age: 45 y.o. MRN: [id number]  CC: Acute Visit (Pt c/o left ear pain x 7 days, along with some dizziness. Pt states he has been putting otc drops in his ears daily twice a day and now his pain is about a 5 out of 10)  Otalgia  There is pain in the left ear. This is a new problem. The current episode started in the past 7 days. The problem occurs constantly. The problem has been waxing and waning. There has been no fever. Pertinent negatives include no coughing, ear discharge, headaches, hearing loss, neck pain, rhinorrhea or sore throat. He has tried ear drops for the symptoms. The treatment provided mild relief.   Reviewed past Medical, Social and Family history today.  Outpatient Medications Prior to Visit  Medication Sig Dispense Refill  . fluticasone (FLONASE) 50 MCG/ACT nasal spray Place into the nose.    . montelukast (SINGULAIR) 10 MG tablet Take 1 tablet (10 mg total) by mouth at bedtime. Need office visit for additional refills 90 tablet 0  . predniSONE (DELTASONE) 20 MG tablet Take 3 tabs at the same time x3 days, then 2 tabs at the same time x3 days, then 1 tab daily x3 days (Patient not taking: Reported on 04/08/2020) 18 tablet 0   No facility-administered medications prior to visit.    ROS See HPI  Objective:  BP 114/78 (BP Location: Left Arm, Patient Position: Sitting, Cuff Size: Large)   Pulse 92   Temp 97.7 F (36.5 C) (Temporal)   Ht 5\' 8"  (1.727 m)   Wt 195 lb 6.4 oz (88.6 kg)   SpO2 96%   BMI 29.71 kg/m   Physical Exam Vitals reviewed.  HENT:     Right Ear: Tympanic membrane, ear canal and external ear normal. No middle ear effusion. There is no impacted cerumen. No mastoid tenderness. Tympanic membrane is not injected.     Left Ear: External ear normal. Drainage present.  No middle ear effusion. There is no impacted cerumen. No mastoid tenderness. Tympanic membrane is injected.   Musculoskeletal:     Cervical back: Normal range of motion and neck supple.  Lymphadenopathy:     Cervical: No cervical adenopathy.  Neurological:     Mental Status: He is alert.    Assessment & Plan:  This visit occurred during the SARS-CoV-2 public health emergency.  Safety protocols were in place, including screening questions prior to the visit, additional usage of staff PPE, and extensive cleaning of exam room while observing appropriate contact time as indicated for disinfecting solutions.   Benen was seen today for acute visit.  Diagnoses and all orders for this visit:  Acute diffuse otitis externa of left ear -     neomycin-polymyxin-hydrocortisone (CORTISPORIN) OTIC solution; Place 4 drops into the left ear 3 (three) times daily. x10days    Problem List Items Addressed This Visit   None   Visit Diagnoses    Acute diffuse otitis externa of left ear    -  Primary   Relevant Medications   neomycin-polymyxin-hydrocortisone (CORTISPORIN) OTIC solution      Follow-up: No follow-ups on file.  01-13-1993, NP

## 2020-04-08 NOTE — Patient Instructions (Signed)

## 2020-04-10 ENCOUNTER — Telehealth: Payer: Self-pay | Admitting: Nurse Practitioner

## 2020-04-10 NOTE — Telephone Encounter (Signed)
Pt states he has been vaccinated.  Pt states he got the moderna.  He is at work, but will upload his card today when he gets home.

## 2020-05-15 ENCOUNTER — Encounter: Payer: Self-pay | Admitting: Family Medicine

## 2020-05-15 ENCOUNTER — Other Ambulatory Visit (HOSPITAL_BASED_OUTPATIENT_CLINIC_OR_DEPARTMENT_OTHER): Payer: Self-pay

## 2020-05-15 ENCOUNTER — Other Ambulatory Visit: Payer: Self-pay | Admitting: Family Medicine

## 2020-05-15 ENCOUNTER — Telehealth (INDEPENDENT_AMBULATORY_CARE_PROVIDER_SITE_OTHER): Payer: No Typology Code available for payment source | Admitting: Family Medicine

## 2020-05-15 VITALS — Ht 68.0 in | Wt 190.0 lb

## 2020-05-15 DIAGNOSIS — K644 Residual hemorrhoidal skin tags: Secondary | ICD-10-CM

## 2020-05-15 MED ORDER — HYDROCORTISONE (PERIANAL) 2.5 % EX CREA
1.0000 "application " | TOPICAL_CREAM | Freq: Two times a day (BID) | CUTANEOUS | 0 refills | Status: DC
  Filled 2020-05-15: qty 30, 10d supply, fill #0

## 2020-05-15 NOTE — Progress Notes (Signed)
[facility] [address] [place] [address] Dept: [phone] Dept Fax: [phone]  Virtual Video Visit  I connected with Crista Curb on 05/15/20 at  8:00 AM EST by a video enabled telemedicine application and verified that I am speaking with the correct person using two identifiers.  Location patient: Ambulance person provider: Clinic Persons participating in the virtual visit: Patient, Provider  I discussed the limitations of evaluation and management by telemedicine and the availability of in person appointments. The patient expressed understanding and agreed to proceed.  Chief Complaint  Patient presents with  . Acute Visit    C/o having external hemorrhoid x 3 weeks (last time was 1 year ago).  He wants a referral GI.     SUBJECTIVE:  HPI: Thomas Suarez is a 45 y.o. male who presents with a complaint of a recurrence of inflamed hemorrhoids. [name] has a past history of hemorrhoids, with his last flare having been in Nov. 2021. He notes that his current flare started about a month ago. He describes a protruding hemorrhoid that is painful and intermittently bleeds. He also notes perianal itching. He has been using ibuprofen and Tylenol for pain.  Patient Active Problem List   Diagnosis Date Noted  . Inflamed external hemorrhoid 05/15/2020  . Perennial allergic rhinitis with seasonal variation 10/25/2016  . Mixed hyperlipidemia 10/25/2016  . Chronic allergic conjunctivitis 10/25/2016   No past surgical history on file.  Family History  Problem Relation Age of Onset  . Hypertension Maternal Grandmother    Social History   Tobacco Use  . Smoking status: Former Smoker    Types: Cigars    Quit date: 03/07/2008    Years since quitting: 12.1  . Smokeless tobacco: Never Used  Vaping Use  . Vaping Use: Never used  Substance Use Topics  . Alcohol use: No  . Drug use: No    Current Outpatient  Medications:  .  montelukast (SINGULAIR) 10 MG tablet, Take 1 tablet (10 mg total) by mouth at bedtime. Need office visit for additional refills, Disp: 90 tablet, Rfl: 0  No Known Allergies  ROS: See pertinent positives and negatives per HPI.  OBSERVATIONS/OBJECTIVE:  VITALS per patient if applicable: Today's Vitals   05/15/20 0804  Weight: 190 lb (86.2 kg)  Height: 5\' 8"  (1.727 m)   Body mass index is 28.89 kg/m.   GENERAL: alert, oriented, appears well and in no acute distress  PSYCH/NEURO: pleasant and cooperative, no obvious depression or anxiety, speech and thought processing grossly intact  ASSESSMENT AND PLAN:  1. Inflamed external hemorrhoid Mr. Nordin has a recurrence of an inflamed hemorrhoid with bleeding. He is interested in having something definitive done to resolve the issue. He was previously referred to a gastroenterologist, who treated him medically. I will refer him to general surgery for consideration of a surgical approach. In the meantime, I will prescribe a hydrocortisone rectal cream to see if we can get him more comfortable.  - Ambulatory referral to General Surgery - hydrocortisone (ANUSOL-HC) 2.5 % rectal cream; Place 1 application rectally 2 (two) times daily.  Dispense: 30 g; Refill: 0   I discussed the assessment and treatment plan with the patient. The patient was provided an opportunity to ask questions and all were answered. The patient agreed with the plan and demonstrated an understanding of the instructions.   The patient was advised to call back or seek an in-person evaluation if the symptoms worsen or if the  condition fails to improve as anticipated.   Loyola Mast, MD

## 2020-05-15 NOTE — Patient Instructions (Signed)

## 2020-06-05 ENCOUNTER — Other Ambulatory Visit (HOSPITAL_BASED_OUTPATIENT_CLINIC_OR_DEPARTMENT_OTHER): Payer: Self-pay

## 2020-06-15 ENCOUNTER — Ambulatory Visit: Payer: Self-pay | Admitting: Surgery

## 2020-06-15 NOTE — H&P (Signed)
Thomas Suarez Appointment: 06/15/2020 4:00 PM Location: [facility] Patient #: [id number] DOB: 1975/09/13 Single / Language: [place] / Race: Black or African American Male  History of Present Illness Ardeth Sportsman(Eathel Pajak C. Ahnesty Finfrock MD; 06/15/2020 4:24 PM) The patient is a 45 year old male who presents with anal pain. Note for "Anal pain": ` ` ` Patient sent for surgical consultation at the request of Nche, Bonna Gainsharlotte Lum, NP  Chief Complaint: Anal pain and swelling. Probable inflamed hemorrhoid. ` ` Patient returns. I had seen him in late 2020 for inflamed swollen internal hemorrhoids. He was acutely quite edematous and swollen. I recommended soaks to to allow the flare to calm down and avoid urgent surgery. Recommended call back in a few weeks. Consider reevaluation with possible elective hemorrhoidectomy surgery. I never heard back. He notes he's been trying to get these under control with warm soaks. He is just a low fat diet. He says he fast 16 hours and only eats during the middle of the day. Has urgency but tries to have only 1 or 2 bowel movements a day. Takes ibuprofen and Tylenol regularly to deal with the burning discomfort with bowel movements. No some occasional blood. He had to has switched jobs which delayed his insurance coverage. He wanted to reconsider surgery since he still struggling. He works as a Paramedictherapist. Denies any fecal urgency. Not taking any antidiarrheals. Trying to do warm soaks in Epsom salts. Working in gym rather regularly.    PRIOR NOTE 2020: The patient is a otherwise healthy male who noticed some anal pain and swelling a couple months ago. Noticed he was started work the gym and wonders if this trigger with some straining. Also had some harder stools that he had to strain. Nonetheless he developed some painful swelling perianally. Suspected hemorrhoid. Tried over-the-counter medications. Not adequate. Occasional bleeding as well.  Persisted. Concerned him. He reached out to primary care office. Televisit. Because of pain and bleeding placed on nitroglycerin. He got severe headaches & anal pain not better. Switched to suppositories and Epsom salt soaks.& Epsom soaks. Still struggling. He wished something more aggressive be done. Initially was going to send a gastroenterology but then wished to consider surgery.  Since his flare a few months ago, things have calmed down somewhat. He still getting bleeding. He still has some pain but it is more manageable. Trying to use Tylenol and ibuprofen. Some burning with a bowel movement but not severe sharp pain. More burning and itching for up to a few hours. Used to be very intense to the point where he cannot sleep at night. A little better now but still frustrating.  He moves his bowels usually about 4-5 times a day still. That is been his baseline for many years. He's never had any gastrointestinal issues. No family history of bowel problems. He can walk several miles without difficulty. Does not smoke. Not diabetic. He is normally not on any medications. Otherwise very healthy. There is had any abdominal surgery. Has not ever had any anorectal or hemorrhoid interventions or other issues. No personal nor family history of GI/colon cancer, inflammatory bowel disease, irritable bowel syndrome, allergy such as Celiac Sprue, dietary/dairy problems, colitis, ulcers nor gastritis. No recent sick contacts/gastroenteritis. No travel outside the country. No changes in diet. No dysphagia to solids or liquids. No significant heartburn or reflux. No hematochezia, hematemesis, coffee ground emesis. No evidence of prior gastric/peptic ulceration.  (Review of systems as stated in this history (HPI) or in the review of  systems. Otherwise all other 12 point ROS are negative) ` ` `   Problem List/Past Medical Ardeth Sportsman, MD; 06/15/2020 4:01 PM) PROLAPSED INTERNAL  HEMORRHOIDS, GRADE 3 (K64.2) INFLAMED INTERNAL HEMORRHOID (H60.7)  Past Surgical History Ardeth Sportsman, MD; 06/15/2020 4:01 PM) No pertinent past surgical history  Diagnostic Studies History Ardeth Sportsman, MD; 06/15/2020 4:01 PM) Colonoscopy never  Allergies Marliss Coots, CNA; 06/15/2020 3:37 PM) No Known Drug Allergies [02/11/2019]: Allergies Reconciled  Medication History Marliss Coots, CNA; 06/15/2020 3:37 PM) Montelukast Sodium (10MG  Tablet, Oral) Active. Medications Reconciled  Social History , MD; 06/15/2020 4:01 PM) Alcohol use Remotely quit alcohol use. Caffeine use Coffee. No drug use Tobacco use Former smoker.  Family History 08/15/2020, MD; 06/15/2020 4:01 PM) First Degree Relatives No pertinent family history  Other Problems 08/15/2020, MD; 06/15/2020 4:01 PM) No pertinent past medical history     Review of Systems 08/15/2020, MD; 06/15/2020 4:1 PM) General Not Present- Appetite Loss, Chills, Fatigue, Fever, Night Sweats, Weight Gain and Weight Loss. Skin Not Present- Change in Wart/Mole, Dryness, Hives, Jaundice, New Lesions, Non-Healing Wounds, Rash and Ulcer. HEENT Not Present- Earache, Hearing Loss, Hoarseness, Nose Bleed, Oral Ulcers, Ringing in the Ears, Seasonal Allergies, Sinus Pain, Sore Throat, Visual Disturbances, Wears glasses/contact lenses and Yellow Eyes. Respiratory Not Present- Bloody sputum, Chronic Cough, Difficulty Breathing, Snoring and Wheezing. Breast Not Present- Breast Mass, Breast Pain, Nipple Discharge and Skin Changes. Cardiovascular Not Present- Chest Pain, Difficulty Breathing Lying Down, Leg Cramps, Palpitations, Rapid Heart Rate, Shortness of Breath and Swelling of Extremities. Gastrointestinal Not Present- Abdominal Pain, Bloating, Bloody Stool, Change in Bowel Habits, Chronic diarrhea, Constipation, Difficulty Swallowing, Excessive gas, Gets full quickly at meals, Hemorrhoids,  Indigestion, Nausea, Rectal Pain and Vomiting. Male Genitourinary Not Present- Blood in Urine, Change in Urinary Stream, Frequency, Impotence, Nocturia, Painful Urination, Urgency and Urine Leakage.   Physical Exam 08/15/2020, MD; 06/15/2020 4:1 PM)  General Mental Status-Alert. General Appearance-Not in acute distress, Not Sickly. Orientation-Oriented X3. Hydration-Well hydrated. Voice-Normal.  Integumentary Global Assessment Upon inspection and palpation of skin surfaces of the - Axillae: non-tender, no inflammation or ulceration, no drainage. and Distribution of scalp and body hair is normal. General Characteristics Temperature - normal warmth is noted.  Head and Neck Head-normocephalic, atraumatic with no lesions or palpable masses. Face Global Assessment - atraumatic, no absence of expression. Neck Global Assessment - no abnormal movements, no bruit auscultated on the right, no bruit auscultated on the left, no decreased range of motion, non-tender. Trachea-midline. Thyroid Gland Characteristics - non-tender.  Eye Eyeball - Left-Extraocular movements intact, No Nystagmus - Left. Eyeball - Right-Extraocular movements intact, No Nystagmus - Right. Cornea - Left-No Hazy - Left. Cornea - Right-No Hazy - Right. Sclera/Conjunctiva - Left-No scleral icterus, No Discharge - Left. Sclera/Conjunctiva - Right-No scleral icterus, No Discharge - Right. Pupil - Left-Direct reaction to light normal. Pupil - Right-Direct reaction to light normal.  ENMT Ears Pinna - Left - no drainage observed, no generalized tenderness observed. Pinna - Right - no drainage observed, no generalized tenderness observed. Nose and Sinuses External Inspection of the Nose - no destructive lesion observed. Inspection of the nares - Left - quiet respiration. Inspection of the nares - Right - quiet respiration. Mouth and Throat Lips - Upper Lip - no fissures observed, no  pallor noted. Lower Lip - no fissures observed, no pallor noted. Nasopharynx - no discharge present. Oral Cavity/Oropharynx - Tongue - no dryness  observed. Oral Mucosa - no cyanosis observed. Hypopharynx - no evidence of airway distress observed.  Chest and Lung Exam Inspection Movements - Normal and Symmetrical. Accessory muscles - No use of accessory muscles in breathing. Palpation Palpation of the chest reveals - Non-tender. Auscultation Breath sounds - Normal and Clear.  Cardiovascular Auscultation Rhythm - Regular. Murmurs & Other Heart Sounds - Auscultation of the heart reveals - No Murmurs and No Systolic Clicks.  Abdomen Inspection Inspection of the abdomen reveals - No Visible peristalsis and No Abnormal pulsations. Umbilicus - No Bleeding, No Urine drainage. Palpation/Percussion Palpation and Percussion of the abdomen reveal - Soft, Non Tender, No Rebound tenderness, No Rigidity (guarding) and No Cutaneous hyperesthesia. Note: Abdomen soft. Nontender. Not distended. Very small umbilical hernia through stalk only. Moderate diastases recti. No guarding.  Male Genitourinary Sexual Maturity Tanner 5 - Adult hair pattern and Adult penile size and shape. Note: No inguinal hernias. Normal external genitalia. Epididymi, testes, and spermatic cords normal without any masses.  Rectal Note: Inflamed left lateral hemorrhoid partially prolapsing out with edema and sensitivity. Mildly friable but no major bleeding. No obvious fissure. Enlarged right posterior and right anterior hemorrhoids probably grade 2.  Perianal skin clean with good hygiene. No pruritis ani. No pilonidal disease. No fissure. No abscess/fistula. Normal sphincter tone.  No condyloma warts. Barely tolerates digital exam. I held off on anoscopic exam. I can feel a smooth uninflamed prostate without nodularity. No obvious masses felt to 4 cm  Peripheral Vascular Upper Extremity Inspection - Left -  No Cyanotic nailbeds - Left, Not Ischemic. Inspection - Right - No Cyanotic nailbeds - Right, Not Ischemic.  Neurologic Neurologic evaluation reveals -normal attention span and ability to concentrate, able to name objects and repeat phrases. Appropriate fund of knowledge , normal sensation and normal coordination. Mental Status Affect - not angry, not paranoid. Cranial Nerves-Normal Bilaterally. Gait-Normal.  Neuropsychiatric Mental status exam performed with findings of-able to articulate well with normal speech/language, rate, volume and coherence, thought content normal with ability to perform basic computations and apply abstract reasoning and no evidence of hallucinations, delusions, obsessions or homicidal/suicidal ideation.  Musculoskeletal Global Assessment Spine, Ribs and Pelvis - no instability, subluxation or laxity. Right Upper Extremity - no instability, subluxation or laxity.  Lymphatic Head & Neck General Head & Neck Lymphatics: Bilateral - Description - No Localized lymphadenopathy. Axillary General Axillary Region: Bilateral - Description - No Localized lymphadenopathy. Femoral & Inguinal Generalized Femoral & Inguinal Lymphatics: Left - Description - No Localized lymphadenopathy. Right - Description - No Localized lymphadenopathy.    Assessment & Plan Ardeth Sportsman MD; 06/15/2020 4:23 PM)  PROLAPSED INTERNAL HEMORRHOIDS, GRADE 3 (K64.2) Impression: Patient with hemorrhoid struggles with urgency and discomfort.  He is not actively inflamed right now. He is too sensitive to tolerate anoscopy so he is not a good candidate for banding.  I would recommend hemorrhoidal ligation of pexy with probable hemorrhoidectomy of at least the left lateral pile that I suspect is intermittently prolapsing.  I suspect he will struggle with postoperative pain especially the first few weeks since he is rather sensitive with some irregular bowels but hopefully with pain  control and regimen he will improve. He's been working to be compliant on prior recommendations.  Seems somewhat taken aback that he would need at least a couple weeks off of work to recover. I noted this is a painful surgery and he will need that time. I have no problem writing letters and FMLA to help  protect his job. He will think about things and let us know.  The anatomy & physiology of the anorectal region was discussed. The pathophysiology of hemorrhoids and differential diagnosis was discussed. Natural history progression was discussed. I stressed the importance of a bowel regimen to have daily soft bowel movements to minimize progression of disease. Goal of one BM / day ideal. Use of wet wipes, warm baths, avoiding straining, etc were emphasized.  Educational handouts further explaining the pathology, treatment options, and bowel regimen were given as well. The patient expressed understanding.  Current Plans You are being scheduled for surgery- Our schedulers will call you.  You should hear from our office's scheduling department within 5 working days about the location, date, and time of surgery. We try to make accommodations for patient's preferences in scheduling surgery, but sometimes the OR schedule or the surgeon's schedule prevents [place] from making those accommodations.  If you have not heard from our office [phone]) in 5 working days, call the office and ask for your surgeon's nurse.  If you have other questions about your diagnosis, plan, or surgery, call the office and ask for your surgeon's nurse.  Pt Education - [facility] Hemorrhoids (Skylarr Liz): discussed with patient and provided information. Pt Education - Pamphlet Given - The Hemorrhoid Book: discussed with patient and provided information.  EXTERNAL HEMORRHOIDS WITH COMPLICATION (K64.4)   PROLAPSED INTERNAL HEMORRHOIDS, GRADE 2 (K64.1)  Current Plans Pt Education - [facility] Hemorrhoids (Jaydan Chretien): discussed with patient and  provided information.  ENCOUNTER FOR PREOPERATIVE EXAMINATION FOR GENERAL SURGICAL PROCEDURE (Z01.818)  Current Plans Pt Education - [facility] Rectal Prep for Anorectal outpatient/office surgery: discussed with patient and provided information. Pt Education - [facility] Rectal Surgery HCI (Claudina Oliphant): discussed with patient and provided information. The anatomy & physiology of the anorectal region was discussed. The pathophysiology of hemorrhoids and differential diagnosis was discussed. Natural history risks without surgery was discussed. I stressed the importance of a bowel regimen to have daily soft bowel movements to minimize progression of disease. Interventions such as sclerotherapy & banding were discussed.  The patient's symptoms are not adequately controlled by medicines and other non-operative treatments. I feel the risks & problems of no surgery outweigh the operative risks; therefore, I recommended surgery to treat the hemorrhoids by ligation, pexy, and possible resection.  Risks such as bleeding, infection, urinary difficulties, need for further treatment, heart attack, death, and other risks were discussed. I noted a good likelihood this will help address the problem. Goals of post-operative recovery were discussed as well. Possibility that this will not correct all symptoms was explained. Post-operative pain, bleeding, constipation, and other problems after surgery were discussed. We will work to minimize complications. Educational handouts further explaining the pathology, treatment options, and bowel regimen were given as well. Questions were answered. The patient expresses understanding & wishes to proceed   Ardeth Sportsman, MD, FACS, MASCRS  Esophageal, Gastrointestinal & Colorectal Surgery Robotic and Minimally Invasive Surgery [facility] [address] [place], [place] [address] [phone] Fax [phone] Main/Paging  CONTACT  INFORMATION: Weekday (9AM-5PM) concerns: Call [facility] main office at [phone] Weeknight (5PM-9AM) or Weekend/Holiday concerns: Check [email or web address] for General Surgery [facility] coverage (Please, do not use SecureChat as it is not reliable communication to operating surgeons for immediate patient care)

## 2020-06-25 ENCOUNTER — Other Ambulatory Visit: Payer: No Typology Code available for payment source

## 2020-07-06 ENCOUNTER — Telehealth: Payer: Self-pay | Admitting: Nurse Practitioner

## 2020-07-06 NOTE — Telephone Encounter (Signed)
He has not had labs nor complete physical since 2020. This needs to be done first

## 2020-07-06 NOTE — Telephone Encounter (Signed)
Pt called and said he was trying to donate plasma [facility] in [place], [address], [place], [place] [address]. They said he needed a list of medications that he is currently on and a letter from his provider saying that he can donate plasma. Callback is [phone]

## 2020-07-07 NOTE — Telephone Encounter (Signed)
I scheduled pt's CPE with Nche on 08/13/20.

## 2020-07-09 ENCOUNTER — Telehealth: Payer: Self-pay | Admitting: Nurse Practitioner

## 2020-07-09 ENCOUNTER — Other Ambulatory Visit (HOSPITAL_COMMUNITY): Payer: Self-pay

## 2020-07-09 MED ORDER — MONTELUKAST SODIUM 10 MG PO TABS
10.0000 mg | ORAL_TABLET | Freq: Every day | ORAL | 0 refills | Status: DC
  Filled 2020-07-09: qty 90, 90d supply, fill #0

## 2020-07-09 NOTE — Telephone Encounter (Signed)
Chart supports Rx 

## 2020-07-09 NOTE — Telephone Encounter (Signed)
.  What is the name of the medication? Rx #: [id number]  Thomas Suarez [[id number]]    Have you contacted your pharmacy to request a refill? Yes, pt is requesting a refill.  Which pharmacy would you like this sent to? Pharmacy  [facility]  [address] [place] [address]  Phone:  [phone] Fax:  [phone]  DEA #:  [id number]      Patient notified that their request is being sent to the clinical staff for review and that they should receive a call once it is complete. If they do not receive a call within 72 hours they can check with their pharmacy or our office.

## 2020-07-13 ENCOUNTER — Other Ambulatory Visit (HOSPITAL_COMMUNITY): Payer: Self-pay

## 2020-08-13 ENCOUNTER — Encounter: Payer: No Typology Code available for payment source | Admitting: Nurse Practitioner

## 2020-09-18 ENCOUNTER — Telehealth: Payer: No Typology Code available for payment source | Admitting: Physician Assistant

## 2020-09-18 ENCOUNTER — Other Ambulatory Visit (HOSPITAL_COMMUNITY): Payer: Self-pay

## 2020-09-18 DIAGNOSIS — R21 Rash and other nonspecific skin eruption: Secondary | ICD-10-CM

## 2020-09-18 MED ORDER — PREDNISONE 10 MG (21) PO TBPK
ORAL_TABLET | ORAL | 0 refills | Status: DC
  Filled 2020-09-18: qty 21, 6d supply, fill #0

## 2020-09-18 NOTE — Progress Notes (Signed)
E Visit for Rash  We are sorry that you are not feeling well. Here is how we plan to help!  Based on what you shared with me it looks like you have contact dermatitis.  Contact dermatitis is a skin rash caused by something that touches the skin and causes irritation or inflammation.  Your skin may be red, swollen, dry, cracked, and itch.  The rash should go away in a few days but can last a few weeks.  If you get a rash, it's important to figure out what caused it so the irritant can be avoided in the future.  Prednisone 10 mg daily for 6 days (see taper instructions below)  Directions for 6 day taper: Day 1: 2 tablets before breakfast, 1 after both lunch & dinner and 2 at bedtime Day 2: 1 tab before breakfast, 1 after both lunch & dinner and 2 at bedtime Day 3: 1 tab at each meal & 1 at bedtime Day 4: 1 tab at breakfast, 1 at lunch, 1 at bedtime Day 5: 1 tab at breakfast & 1 tab at bedtime Day 6: 1 tab at breakfast    HOME CARE:  Take cool showers and avoid direct sunlight. Apply cool compress or wet dressings. Take a bath in an oatmeal bath.  Sprinkle content of one Aveeno packet under running faucet with comfortably warm water.  Bathe for 15-20 minutes, 1-2 times daily.  Pat dry with a towel. Do not rub the rash. Use hydrocortisone cream. Take an antihistamine like Benadryl for widespread rashes that itch.  The adult dose of Benadryl is 25-50 mg by mouth 4 times daily. Caution:  This type of medication may cause sleepiness.  Do not drink alcohol, drive, or operate dangerous machinery while taking antihistamines.  Do not take these medications if you have prostate enlargement.  Read package instructions thoroughly on all medications that you take.  GET HELP RIGHT AWAY IF:  Symptoms don't go away after treatment. Severe itching that persists. If you rash spreads or swells. If you rash begins to smell. If it blisters and opens or develops a yellow-brown crust. You develop a  fever. You have a sore throat. You become short of breath.  MAKE SURE YOU:  Understand these instructions. Will watch your condition. Will get help right away if you are not doing well or get worse.  Thank you for choosing an e-visit.  Your e-visit answers were reviewed by a board certified advanced clinical practitioner to complete your personal care plan. Depending upon the condition, your plan could have included both over the counter or prescription medications.  Please review your pharmacy choice. Make sure the pharmacy is open so you can pick up prescription now. If there is a problem, you may contact your provider through MyChart messaging and have the prescription routed to another pharmacy.  Your safety is important to us. If you have drug allergies check your prescription carefully.   For the next 24 hours you can use MyChart to ask questions about today's visit, request a non-urgent call back, or ask for a work or school excuse. You will get an email in the next two days asking about your experience. I hope that your e-visit has been valuable and will speed your recovery.  I provided 5 minutes of non face-to-face time during this encounter for chart review and documentation.   

## 2020-10-22 ENCOUNTER — Other Ambulatory Visit (HOSPITAL_COMMUNITY): Payer: Self-pay

## 2020-10-22 ENCOUNTER — Other Ambulatory Visit: Payer: Self-pay | Admitting: Nurse Practitioner

## 2020-10-23 ENCOUNTER — Other Ambulatory Visit (HOSPITAL_COMMUNITY): Payer: Self-pay

## 2020-10-23 MED FILL — Montelukast Sodium Tab 10 MG (Base Equiv): ORAL | 90 days supply | Qty: 90 | Fill #0 | Status: AC

## 2020-10-27 ENCOUNTER — Other Ambulatory Visit (HOSPITAL_COMMUNITY): Payer: Self-pay

## 2020-10-30 ENCOUNTER — Encounter: Payer: Self-pay | Admitting: Nurse Practitioner

## 2020-10-30 ENCOUNTER — Other Ambulatory Visit: Payer: Self-pay

## 2020-10-30 ENCOUNTER — Ambulatory Visit (INDEPENDENT_AMBULATORY_CARE_PROVIDER_SITE_OTHER): Payer: No Typology Code available for payment source | Admitting: Nurse Practitioner

## 2020-10-30 ENCOUNTER — Other Ambulatory Visit (HOSPITAL_COMMUNITY): Payer: Self-pay

## 2020-10-30 VITALS — BP 112/80 | HR 80 | Temp 97.8°F | Ht 68.0 in | Wt 193.4 lb

## 2020-10-30 DIAGNOSIS — R351 Nocturia: Secondary | ICD-10-CM | POA: Insufficient documentation

## 2020-10-30 DIAGNOSIS — E782 Mixed hyperlipidemia: Secondary | ICD-10-CM

## 2020-10-30 DIAGNOSIS — J3089 Other allergic rhinitis: Secondary | ICD-10-CM

## 2020-10-30 DIAGNOSIS — Z0001 Encounter for general adult medical examination with abnormal findings: Secondary | ICD-10-CM | POA: Diagnosis not present

## 2020-10-30 DIAGNOSIS — K644 Residual hemorrhoidal skin tags: Secondary | ICD-10-CM | POA: Diagnosis not present

## 2020-10-30 DIAGNOSIS — J302 Other seasonal allergic rhinitis: Secondary | ICD-10-CM

## 2020-10-30 DIAGNOSIS — Z1211 Encounter for screening for malignant neoplasm of colon: Secondary | ICD-10-CM

## 2020-10-30 DIAGNOSIS — H60311 Diffuse otitis externa, right ear: Secondary | ICD-10-CM

## 2020-10-30 MED ORDER — MONTELUKAST SODIUM 10 MG PO TABS
10.0000 mg | ORAL_TABLET | Freq: Every day | ORAL | 3 refills | Status: AC
  Filled 2020-10-30 – 2021-02-03 (×2): qty 90, 90d supply, fill #0

## 2020-10-30 MED ORDER — HYDROCORTISONE (PERIANAL) 2.5 % EX CREA
TOPICAL_CREAM | Freq: Two times a day (BID) | CUTANEOUS | 0 refills | Status: DC
  Filled 2020-10-30: qty 30, 10d supply, fill #0

## 2020-10-30 MED ORDER — HYDROCORTISONE-ACETIC ACID 1-2 % OT SOLN
3.0000 [drp] | Freq: Three times a day (TID) | OTIC | 0 refills | Status: AC
Start: 2020-10-30 — End: ?
  Filled 2020-10-30: qty 10, 22d supply, fill #0

## 2020-10-30 NOTE — Assessment & Plan Note (Signed)
Wakes up 2-3x/night use urinate Ongoing for several months, he thinks this is due to water consumption close to bedtime. No change in urinary stream, no hematuria, no dysuria, no incontinence, no urgency, no frequency during the day. No FHx of BPH or prostate cancer.  Advised to stop water consumption within 3hrs to bedtime Check PSA today.

## 2020-10-30 NOTE — Assessment & Plan Note (Signed)
Persistent right ear drainage with intermittent pain No external ear swelling No improvement with cortisporin. Denies any swimming and no use of cotton swabs.  Sent vosol-hc Continue to avoid using cotton swabs and keep ears dry as much as possible

## 2020-10-30 NOTE — Progress Notes (Signed)
Subjective:    Patient ID: Thomas Suarez, male    DOB: 13-Jan-1976, 45 y.o.   MRN: [id number]  Patient presents today for CPE and eval of chronic condition  HPI No problem-specific Assessment & Plan notes found for this encounter.  Vision:up to date Dental: up to date Diet:heart health, high fiber Exercise: cardio and weight training daily Weight:  Wt Readings from Last 3 Encounters:  10/30/20 193 lb 6.4 oz (87.7 kg)  05/15/20 190 lb (86.2 kg)  04/08/20 195 lb 6.4 oz (88.6 kg)    Sexual History (orientation,birth control, marital status, STD):married, sexually active, denies needed for STI screen, deferred DRE due to bulging and painful hemorrhoid  Depression/Suicide: Depression screen [facility] 2/9 10/30/2020 01/01/2019  Decreased Interest 0 0  Down, Depressed, Hopeless 0 0  PHQ - 2 Score 0 0   Immunizations: (TDAP, Hep C screen, Pneumovax, Influenza, zoster)  Health Maintenance  Topic Date Due   Pneumococcal Vaccination (1 - PCV) Never done   COVID-19 Vaccine (3 - Moderna risk series) 06/07/2019   Colon Cancer Screening  Never done   Flu Shot  10/05/2020   Hepatitis C Screening: USPSTF Recommendation to screen - Ages 18-79 yo.  10/30/2021*   Tetanus Vaccine  12/31/2028   HIV Screening  Completed   HPV Vaccine  Aged Out  *Topic was postponed. The date shown is not the original due date.   Fall Risk: Fall Risk  10/30/2020 01/01/2019  Falls in the past year? 0 0  Number falls in past yr: 0 -  Injury with Fall? 0 -  Risk for fall due to : No Fall Risks -  Follow up Falls evaluation completed -   Advanced Directive: Advanced Directives 02/10/2015  Does Patient Have a Medical Advance Directive? No    Medications and allergies reviewed with patient and updated if appropriate.  Patient Active Problem List   Diagnosis Date Noted   Chronic diffuse otitis externa of right ear 10/30/2020   Nocturia 10/30/2020   External prolapsed hemorrhoids 05/15/2020   Perennial  allergic rhinitis with seasonal variation 10/25/2016   Mixed hyperlipidemia 10/25/2016   Chronic allergic conjunctivitis 10/25/2016   No current outpatient medications on file prior to visit.   No current facility-administered medications on file prior to visit.   Past Medical History:  Diagnosis Date   Allergy    History reviewed. No pertinent surgical history.  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[employer]  Tobacco Use   Smoking status: Former    Types: Cigars    Quit date: 03/07/2008    Years since quitting: 12.6   Smokeless tobacco: Never  Vaping Use   Vaping Use: Never used  Substance and Sexual Activity   Alcohol use: No   Drug use: No   Sexual activity: Yes    Birth control/protection: None  Other Topics Concern   Not on file  Social History Narrative   Not on file   Social Determinants of Health   Financial Resource Strain: Not on file  Food Insecurity: Not on file  Transportation Needs: Not on file  Physical Activity: Not on file  Stress: Not on file  Social Connections: Not on file   Family History  Problem Relation  Age of Onset   Hypertension Maternal Grandmother        Review of Systems  Constitutional:  Negative for fever, malaise/fatigue and weight loss.  HENT:  Negative for congestion and sore throat.   Eyes:        Negative for visual changes  Respiratory:  Negative for cough and shortness of breath.   Cardiovascular:  Negative for chest pain, palpitations and leg swelling.  Gastrointestinal:  Negative for blood in stool, constipation, diarrhea, heartburn and melena.  Genitourinary:  Negative for dysuria, flank pain, frequency, hematuria and urgency.  Musculoskeletal:  Negative for falls, joint pain and myalgias.  Skin:   Negative for rash.  Neurological:  Negative for dizziness, sensory change and headaches.  Endo/Heme/Allergies:  Does not bruise/bleed easily.  Psychiatric/Behavioral:  Negative for depression, substance abuse and suicidal ideas. The patient is not nervous/anxious and does not have insomnia.    Objective:   Vitals:   10/30/20 1311  BP: 112/80  Pulse: 80  Temp: 97.8 F (36.6 C)  SpO2: 96%   Body mass index is 29.41 kg/m.  Physical Examination:  Physical Exam Vitals reviewed. Exam conducted with a chaperone present.  Constitutional:      General: He is not in acute distress.    Appearance: He is well-developed.  HENT:     Right Ear: Tympanic membrane and external ear normal. No decreased hearing noted. Drainage and tenderness present. No swelling. No middle ear effusion. There is no impacted cerumen. No mastoid tenderness. Tympanic membrane is not injected, scarred or perforated.     Left Ear: Tympanic membrane, ear canal and external ear normal. No decreased hearing noted.     Nose: Nose normal.  Eyes:     Extraocular Movements: Extraocular movements intact.     Conjunctiva/sclera: Conjunctivae normal.  Cardiovascular:     Rate and Rhythm: Normal rate and regular rhythm.     Pulses: Normal pulses.     Heart sounds: Normal heart sounds.  Pulmonary:     Effort: Pulmonary effort is normal. No respiratory distress.     Breath sounds: Normal breath sounds.  Chest:     Chest wall: No tenderness.  Abdominal:     General: Bowel sounds are normal.     Palpations: Abdomen is soft.  Genitourinary:    Rectum: Tenderness and external hemorrhoid present. No anal fissure.  Musculoskeletal:        General: Normal range of motion.     Cervical back: Normal range of motion and neck supple.     Right lower leg: No edema.     Left lower leg: No edema.  Skin:    General: Skin is warm and dry.  Neurological:     Mental Status: He is alert and oriented to person, place, and time.      Deep Tendon Reflexes: Reflexes are normal and symmetric.  Psychiatric:        Mood and Affect: Mood normal.        Behavior: Behavior normal.        Thought Content: Thought content normal.   ASSESSMENT and PLAN: This visit occurred during the SARS-CoV-2 public health emergency.  Safety protocols were in place, including screening questions prior to the visit, additional usage of staff PPE, and extensive cleaning of exam room while observing appropriate contact time as indicated for disinfecting solutions.   Pat was seen today for annual exam.  Diagnoses and all orders for this visit:  Encounter for preventative adult health care  exam with abnormal findings -     Comprehensive metabolic panel -     CBC with Differential/Platelet -     TSH  Mixed hyperlipidemia -     Lipid panel  Chronic diffuse otitis externa of right ear -     acetic acid-hydrocortisone (VOSOL-HC) OTIC solution; Place 3 drops into both ears 3 (three) times daily.  External prolapsed hemorrhoids -     hydrocortisone (ANUSOL-HC) 2.5 % rectal cream; Place 1 application rectally 2 (two) times daily.  Nocturia -     PSA  Colon cancer screening -     Ambulatory referral to Gastroenterology  Perennial allergic rhinitis with seasonal variation -     montelukast (SINGULAIR) 10 MG tablet; Take 1 tablet (10 mg total) by mouth at bedtime.     Problem List Items Addressed This Visit       Cardiovascular and Mediastinum   External prolapsed hemorrhoids   Relevant Medications   hydrocortisone (ANUSOL-HC) 2.5 % rectal cream     Respiratory   Perennial allergic rhinitis with seasonal variation   Relevant Medications   montelukast (SINGULAIR) 10 MG tablet     Nervous and Auditory   Chronic diffuse otitis externa of right ear   Relevant Medications   acetic acid-hydrocortisone (VOSOL-HC) OTIC solution     Other   Mixed hyperlipidemia   Relevant Orders   Lipid panel   Nocturia   Relevant Orders   PSA    Other Visit Diagnoses     Encounter for preventative adult health care exam with abnormal findings    -  Primary   Relevant Orders   Comprehensive metabolic panel   CBC with Differential/Platelet   TSH   Colon cancer screening       Relevant Orders   Ambulatory referral to Gastroenterology       Follow up: Return in about 1 year (around 10/30/2021) for CPE (fasting).  Alysia Penna, NP

## 2020-10-30 NOTE — Assessment & Plan Note (Signed)
Repeat lipid apnel

## 2020-10-30 NOTE — Assessment & Plan Note (Addendum)
No improvement with nitrglycerine ointment. Did not use anusol cream due to stain on underpants. Unable to get hemorrhoid due to high copay and also not pleased with [facility]. Today he reports resolved rectal pain, but persistent external hemorrhoid bulging. He has increased daily fiber intake and maintains adequate oral hydration.  Advised to use anusol cream at bedtime. Start sitz bath with warm water daily

## 2020-10-30 NOTE — Patient Instructions (Signed)
Schedule lab app for fasting blood draw. You need to be fasting 6-8hrs prior to blood draw.  Preventive Care 63-46 Years Old, Male Preventive care refers to lifestyle choices and visits with your health care provider that can promote health and wellness. This includes: A yearly physical exam. This is also called an annual wellness visit. Regular dental and eye exams. Immunizations. Screening for certain conditions. Healthy lifestyle choices, such as: Eating a healthy diet. Getting regular exercise. Not using drugs or products that contain nicotine and tobacco. Limiting alcohol use. What can I expect for my preventive care visit? Physical exam Your health care provider will check your: Height and weight. These may be used to calculate your BMI (body mass index). BMI is a measurement that tells if you are at a healthy weight. Heart rate and blood pressure. Body temperature. Skin for abnormal spots. Counseling Your health care provider may ask you questions about your: Past medical problems. Family's medical history. Alcohol, tobacco, and drug use. Emotional well-being. Home life and relationship well-being. Sexual activity. Diet, exercise, and sleep habits. Work and work Astronomer. Access to firearms. What immunizations do I need?  Vaccines are usually given at various ages, according to a schedule. Your health care provider will recommend vaccines for you based on your age, medicalhistory, and lifestyle or other factors, such as travel or where you work. What tests do I need? Blood tests Lipid and cholesterol levels. These may be checked every 5 years, or more often if you are over 50 years old. Hepatitis C test. Hepatitis B test. Screening Lung cancer screening. You may have this screening every year starting at age 42 if you have a 30-pack-year history of smoking and currently smoke or have quit within the past 15 years. Prostate cancer screening. Recommendations will vary  depending on your family history and other risks. Genital exam to check for testicular cancer or hernias. Colorectal cancer screening. All adults should have this screening starting at age 27 and continuing until age 33. Your health care provider may recommend screening at age 55 if you are at increased risk. You will have tests every 1-10 years, depending on your results and the type of screening test. Diabetes screening. This is done by checking your blood sugar (glucose) after you have not eaten for a while (fasting). You may have this done every 1-3 years. STD (sexually transmitted disease) testing, if you are at risk. Follow these instructions at home: Eating and drinking  Eat a diet that includes fresh fruits and vegetables, whole grains, lean protein, and low-fat dairy products. Take vitamin and mineral supplements as recommended by your health care provider. Do not drink alcohol if your health care provider tells you not to drink. If you drink alcohol: Limit how much you have to 0-2 drinks a day. Be aware of how much alcohol is in your drink. In the [place] one drink equals one 12 oz bottle of beer (355 mL), one 5 oz glass of wine (148 mL), or one 1 oz glass of hard liquor (44 mL).  Lifestyle Take daily care of your teeth and gums. Brush your teeth every morning and night with fluoride toothpaste. Floss one time each day. Stay active. Exercise for at least 30 minutes 5 or more days each week. Do not use any products that contain nicotine or tobacco, such as cigarettes, e-cigarettes, and chewing tobacco. If you need help quitting, ask your health care provider. Do not use drugs. If you are sexually active, practice safe  sex. Use a condom or other form of protection to prevent STIs (sexually transmitted infections). If told by your health care provider, take low-dose aspirin daily starting at age 70. Find healthy ways to cope with stress, such as: Meditation, yoga, or listening to  music. Journaling. Talking to a trusted person. Spending time with friends and family. Safety Always wear your seat belt while driving or riding in a vehicle. Do not drive: If you have been drinking alcohol. Do not ride with someone who has been drinking. When you are tired or distracted. While texting. Wear a helmet and other protective equipment during sports activities. If you have firearms in your house, make sure you follow all gun safety procedures. What's next? Go to your health care provider once a year for an annual wellness visit. Ask your health care provider how often you should have your eyes and teeth checked. Stay up to date on all vaccines. This information is not intended to replace advice given to you by your health care provider. Make sure you discuss any questions you have with your healthcare provider. Document Revised: 11/20/2018 Document Reviewed: 02/15/2018 Elsevier Patient Education  2022 [employer].

## 2020-10-30 NOTE — Addendum Note (Signed)
Addended by: Michaela Corner on: 10/30/2020 04:08 PM   Modules accepted: Orders

## 2020-11-02 ENCOUNTER — Other Ambulatory Visit (INDEPENDENT_AMBULATORY_CARE_PROVIDER_SITE_OTHER): Payer: No Typology Code available for payment source

## 2020-11-02 ENCOUNTER — Other Ambulatory Visit: Payer: Self-pay

## 2020-11-02 DIAGNOSIS — E782 Mixed hyperlipidemia: Secondary | ICD-10-CM

## 2020-11-02 DIAGNOSIS — Z0000 Encounter for general adult medical examination without abnormal findings: Secondary | ICD-10-CM | POA: Diagnosis not present

## 2020-11-02 DIAGNOSIS — Z0001 Encounter for general adult medical examination with abnormal findings: Secondary | ICD-10-CM

## 2020-11-02 DIAGNOSIS — R351 Nocturia: Secondary | ICD-10-CM

## 2020-11-02 LAB — LIPID PANEL
Cholesterol: 173 mg/dL (ref 0–200)
HDL: 45.1 mg/dL (ref >39.00)
LDL Cholesterol: 109 mg/dL — ABNORMAL HIGH (ref 0–99)
NonHDL: 127.58
Total CHOL/HDL Ratio: 4
Triglycerides: 93 mg/dL (ref 0.0–149.0)
VLDL: 18.6 mg/dL (ref 0.0–40.0)

## 2020-11-02 LAB — COMPREHENSIVE METABOLIC PANEL
ALT: 17 U/L (ref 0–53)
AST: 17 U/L (ref 0–37)
Albumin: 3.7 g/dL (ref 3.5–5.2)
Alkaline Phosphatase: 55 U/L (ref 39–117)
BUN: 15 mg/dL (ref 6–23)
CO2: 26 mEq/L (ref 19–32)
Calcium: 9.2 mg/dL (ref 8.4–10.5)
Chloride: 106 mEq/L (ref 96–112)
Creatinine, Ser: 1.15 mg/dL (ref 0.40–1.50)
GFR: 76.44 mL/min (ref >60.00)
Glucose, Bld: 87 mg/dL (ref 70–99)
Potassium: 4.3 mEq/L (ref 3.5–5.1)
Sodium: 139 mEq/L (ref 135–145)
Total Bilirubin: 0.5 mg/dL (ref 0.2–1.2)
Total Protein: 5.8 g/dL — ABNORMAL LOW (ref 6.0–8.3)

## 2020-11-02 LAB — CBC WITH DIFFERENTIAL/PLATELET
Basophils Absolute: 0 10*3/uL (ref 0.0–0.1)
Basophils Relative: 1.1 % (ref 0.0–3.0)
Eosinophils Absolute: 0 10*3/uL (ref 0.0–0.7)
Eosinophils Relative: 0.7 % (ref 0.0–5.0)
HCT: 42.4 % (ref 39.0–52.0)
Hemoglobin: 14.3 g/dL (ref 13.0–17.0)
Lymphocytes Relative: 36.9 % (ref 12.0–46.0)
Lymphs Abs: 1.6 10*3/uL (ref 0.7–4.0)
MCHC: 33.7 g/dL (ref 30.0–36.0)
MCV: 97.6 fl (ref 78.0–100.0)
Monocytes Absolute: 0.3 10*3/uL (ref 0.1–1.0)
Monocytes Relative: 5.9 % (ref 3.0–12.0)
Neutro Abs: 2.5 10*3/uL (ref 1.4–7.7)
Neutrophils Relative %: 55.4 % (ref 43.0–77.0)
Platelets: 241 10*3/uL (ref 150.0–400.0)
RBC: 4.34 Mil/uL (ref 4.22–5.81)
RDW: 12.8 % (ref 11.5–15.5)
WBC: 4.4 10*3/uL (ref 4.0–10.5)

## 2020-11-02 LAB — PSA: PSA: 1.02 ng/mL (ref 0.10–4.00)

## 2020-11-02 LAB — TSH: TSH: 0.62 u[IU]/mL (ref 0.35–5.50)

## 2020-11-02 NOTE — Progress Notes (Signed)
Per the orders of NP Charlotte Nche pt is here for labs, pt tolerated draw well.  

## 2020-11-14 ENCOUNTER — Ambulatory Visit (INDEPENDENT_AMBULATORY_CARE_PROVIDER_SITE_OTHER): Payer: No Typology Code available for payment source

## 2020-11-14 ENCOUNTER — Ambulatory Visit
Admission: EM | Admit: 2020-11-14 | Discharge: 2020-11-14 | Disposition: A | Discharge status: Home / Self Care | Payer: No Typology Code available for payment source | Attending: Family Medicine | Admitting: Family Medicine

## 2020-11-14 ENCOUNTER — Encounter: Payer: Self-pay | Admitting: General Practice

## 2020-11-14 ENCOUNTER — Other Ambulatory Visit: Payer: Self-pay

## 2020-11-14 DIAGNOSIS — M25532 Pain in left wrist: Secondary | ICD-10-CM

## 2020-11-14 DIAGNOSIS — M79602 Pain in left arm: Secondary | ICD-10-CM

## 2020-11-14 MED ORDER — TRAMADOL HCL 50 MG PO TABS: 50.0000 mg | ORAL_TABLET | Freq: Four times a day (QID) | ORAL | 0 refills | Status: AC | PRN

## 2020-11-14 NOTE — Discharge Instructions (Addendum)
If not allergic, you may use over the counter ibuprofen or acetaminophen as needed.  Be aware, you have been prescribed pain medications that may cause drowsiness. Do not combine with alcohol or recreational drugs. Please do not drive, operate heavy machinery, or take part in activities that require making important decisions while on this medication as your judgement may be clouded.
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
## 2021-01-21 ENCOUNTER — Telehealth: Payer: Self-pay | Admitting: Nurse Practitioner

## 2021-01-22 ENCOUNTER — Other Ambulatory Visit (HOSPITAL_COMMUNITY): Payer: Self-pay

## 2021-01-22 NOTE — Telephone Encounter (Signed)
Pt calling again for refill of hydrocortisone cream. Send to [facility]  Thank you

## 2021-01-27 ENCOUNTER — Other Ambulatory Visit (HOSPITAL_COMMUNITY): Payer: Self-pay

## 2021-01-27 ENCOUNTER — Other Ambulatory Visit: Payer: Self-pay

## 2021-01-27 DIAGNOSIS — K644 Residual hemorrhoidal skin tags: Secondary | ICD-10-CM

## 2021-01-27 MED ORDER — HYDROCORTISONE (PERIANAL) 2.5 % EX CREA
TOPICAL_CREAM | Freq: Two times a day (BID) | CUTANEOUS | 0 refills | Status: AC
  Filled 2021-01-27: qty 28.35, 15d supply, fill #0

## 2021-02-01 ENCOUNTER — Other Ambulatory Visit (HOSPITAL_COMMUNITY): Payer: Self-pay

## 2021-02-02 ENCOUNTER — Telehealth: Payer: Self-pay | Admitting: Nurse Practitioner

## 2021-02-02 NOTE — Telephone Encounter (Signed)
Pt called to check on his refill for hydrocortisone (ANUSOL-HC) 2.5 % rectal cream and I let him know it shows it was sent in 01/27/21 and he also asked if he could get a refill on montelukast (SINGULAIR) 10 MG tablet.

## 2021-02-03 ENCOUNTER — Other Ambulatory Visit (HOSPITAL_COMMUNITY): Payer: Self-pay

## 2021-02-04 NOTE — Telephone Encounter (Signed)
Pt informed he has refills on file and should be able to contact his pharmacy and just request his refill. Pt states he learned that yesterday and was unaware that he could do that but was thankful we did that for him.

## 2022-06-08 ENCOUNTER — Ambulatory Visit: Payer: Self-pay | Admitting: Nurse Practitioner

## 2022-07-27 ENCOUNTER — Ambulatory Visit: Payer: Self-pay | Admitting: Nurse Practitioner

## 2022-07-27 ENCOUNTER — Telehealth: Payer: Self-pay | Admitting: Nurse Practitioner

## 2022-07-27 NOTE — Telephone Encounter (Signed)
Pt NS on 5/22 no reason available letter sent

## 2022-07-29 NOTE — Telephone Encounter (Signed)
1st no show since 2022, fee waived, letter and text sent
